# Patient Record
Sex: Male | Born: 1975 | Race: Black or African American | Hispanic: No
Health system: Southern US, Community
[De-identification: ages and names within clinical notes are randomized; demographics above are authoritative.]

---

## 2004-07-16 ENCOUNTER — Other Ambulatory Visit: Payer: Self-pay

## 2005-12-05 ENCOUNTER — Other Ambulatory Visit: Payer: Self-pay

## 2005-12-05 ENCOUNTER — Emergency Department: Payer: Self-pay | Admitting: Emergency Medicine

## 2005-12-30 ENCOUNTER — Emergency Department: Payer: Self-pay | Admitting: Emergency Medicine

## 2006-01-05 ENCOUNTER — Emergency Department: Payer: Self-pay | Admitting: Emergency Medicine

## 2006-11-29 ENCOUNTER — Inpatient Hospital Stay: Payer: Self-pay | Admitting: Internal Medicine

## 2007-04-26 ENCOUNTER — Emergency Department: Payer: Self-pay | Admitting: Emergency Medicine

## 2007-09-01 ENCOUNTER — Emergency Department: Payer: Self-pay | Admitting: Emergency Medicine

## 2007-09-24 ENCOUNTER — Ambulatory Visit: Payer: Self-pay

## 2007-10-26 ENCOUNTER — Emergency Department: Payer: Self-pay | Admitting: Emergency Medicine

## 2008-05-05 IMAGING — CR DG CHEST 1V PORT
1 series · 1 of 1 positions shown · non-contrast
Comparison: none

REASON FOR EXAM: Chest pain
COMMENTS:  LMP: (Male)

[view not recorded]
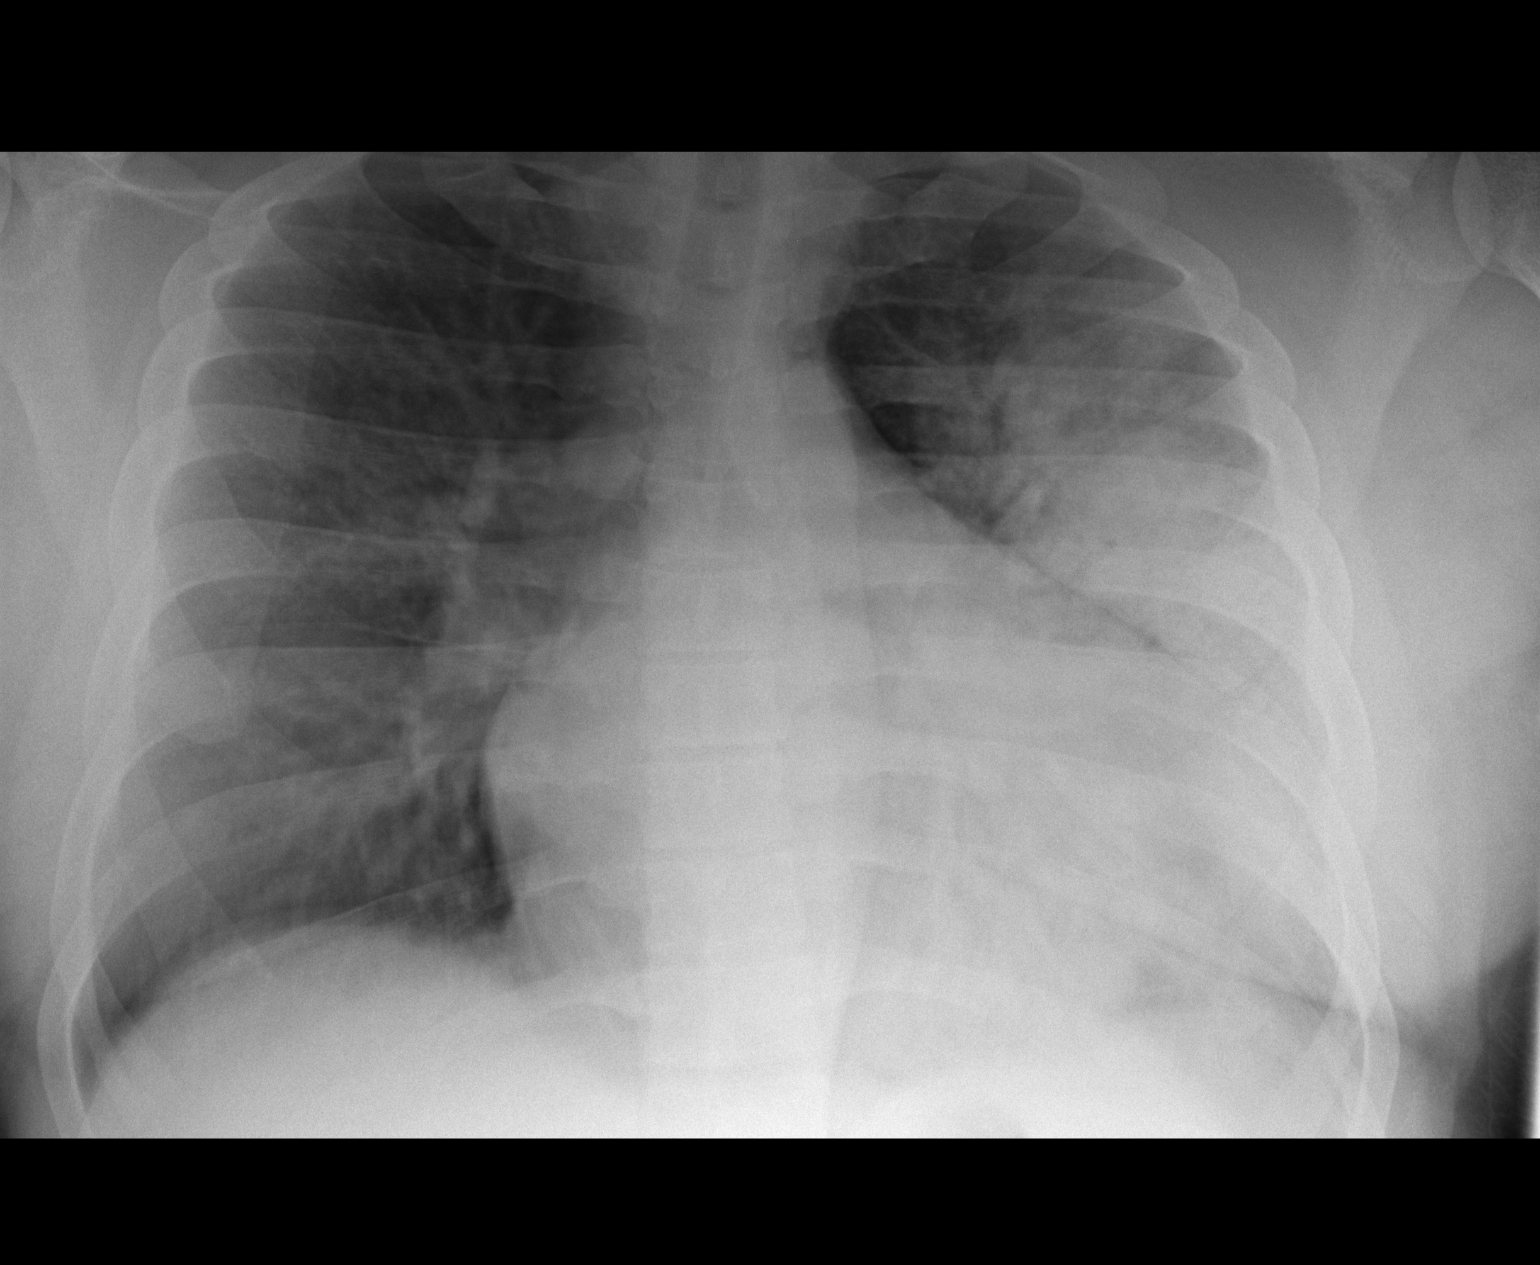

[1 of 1 positions shown; findings below may reference images not displayed]

PROCEDURE:     DXR - DXR PORTABLE CHEST SINGLE VIEW  - November 29, 2006  [DATE]

RESULT:     No recent chest films are available for comparison.

There is density present on the LEFT in the mid and upper hemithorax that is
new since November 2005. This is consistent with acute pneumonia. Soft
tissue density in the perihilar region on the RIGHT is present and stable.
The cardiac silhouette is enlarged but is also stable.
IMPRESSION: There is infiltrate in the LEFT lung consistent with
pneumonia. This appears new since the study of a year ago. A follow-up PA
and lateral chest x-ray would be of value following therapy to assure
clearing.

## 2011-07-03 ENCOUNTER — Encounter: Payer: Self-pay | Admitting: Family Medicine

## 2011-07-23 ENCOUNTER — Encounter: Payer: Self-pay | Admitting: Family Medicine

## 2011-08-23 ENCOUNTER — Encounter: Payer: Self-pay | Admitting: Family Medicine

## 2011-09-22 ENCOUNTER — Encounter: Payer: Self-pay | Admitting: Family Medicine

## 2011-12-05 ENCOUNTER — Inpatient Hospital Stay: Payer: Self-pay | Admitting: Internal Medicine

## 2015-04-15 NOTE — Discharge Summary (Signed)
PATIENT NAME:  Alexander Pratt, Alexander Pratt MR#:  [id number] DATE OF BIRTH:  December 10, 1976  DATE OF ADMISSION:  12/05/2011 DATE OF DISCHARGE:  12/07/2011  PRIMARY CARE PHYSICIAN: [facility]  REASON FOR ADMISSION: Seizure.    DISCHARGE DIAGNOSES:  1. Seizures, likely from medication noncompliance, running out of his antiepileptics at home.  2. Urinary tract infection.  3. Fever likely due to urinary tract infection.  4. Acute renal failure. 5. Hypokalemia.  6. History of hypertension. 7. 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  8. 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 9. 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  10. Leukocytosis.  11. History of chronic indwelling Foley catheter.  CONSULTATIONS: None.   LABORATORY, DIAGNOSTIC AND RADIOLOGICAL DATA:  Portable chest x-ray 12/04/2011: Very poor inspiratory effort. There is cardiomegaly. Very shallow inspiration with pulmonary vascular congestion, which could be artifactual given poor inspiratory effort. No significant effusion is seen. No mass or lobar pneumonia is evident.   Brain MRI without contrast 12/05/2011: Encephalomalacia from large old right middle RCA distribution infarct as well as old infarct in the left corona radiata. There is no acute infarct.   EEG from 12/06/2011: Abnormal EEG due to low amplitude generalized slowing of the background which is a nonspecific finding. Can be seen with multiple etiologies. There is no evidence of subclinical electrographic seizure activity from short recording.   BUN 20, creatinine 1.63 on admission. BUN 12, creatinine 1.06 on the day of discharge.   Serum Dilantin less than 0.4, serum valproic acid less than 3 on admission. Urine drug screen was negative on admission. CBC on admission WBC 11..4, hemoglobin 16.9, hematocrit 50.3, platelets 147.   CBC  normal at the time of discharge except for platelet count very slightly low at 148.   Blood cultures x2 from 12/04/2011: No growth to date.   Influenza antigen was negative.   Urinalysis on admission with cloudy urine, 3+ leukocyte esterase, positive nitrite, 3+ bacteria, 260 WBC. Urine culture from 12/04/2011 returned contaminated.   Serum potassium 3.2 on 12/06/2011 and 3.7 on the day of discharge.   Lipid panel: Total cholesterol 100, total cholesterol 94, triglycerides 100, HDL 28, LDL 46. Hemoglobin A1c 5.6.   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 Please see dictated admission history and physical for pertinent details surrounding the onset of this hospitalization. Please see below for further details. 1. Seizures-Likely secondary to medication noncompliance as patient had ran out of his antiepileptics at home. At home he was supposed to have been taking Keppra as well as Gabapentin. Patient is restarted on Keppra initially intravenously and thereafter he had no further seizures throughout the course of this hospitalization. MRI was obtained and revealed large old right MCA territory cerebrovascular accident with encephalomalacia and old corona radiata infarct but no acute intracranial abnormalities are noted. EEG showed generalized slowing, no epileptiform activity was noted. Since he has been restarted on his antiepileptics he has had no witnessed seizures thereafter in the hospital. Patient and patient's mother were explained the importance of taking antiepileptics as prescribed and to obtain refill for prescriptions prior to medications running out.   2. Urinary tract infection-Based off  abnormal urinalysis. Patient has a chronic indwelling Foley. Urine culture was obtained and initially this was contaminated with fecal flora and after cultures were obtained he was empirically started on IV antibiotics. He was noted to have fevers and this was felt to be from urinary tract infection. He was also noted to have leukocytosis which was felt to be from urinary tract infection versus stress induced. He is not septic and did not have systemic inflammatory response syndrome on admission and he was hemodynamically stable with normal blood pressure and heart rate and was only febrile with elevated WBC count. With antibiotic therapy patient has defervesced and his WBC count has normalized. He has completed five out of seven days of inpatient antibiotics and has two additional days of Levaquin therapy remaining at the time of discharge. Blood cultures did not reveal any growth to date.  3. Acute renal failure-Resolved. This appeared prerenal from volume depletion. His renal function has improved with hydration.  4. Hypokalemia-Potassium was replaced and hypokalemia resolved.  5. Hypertension-Blood pressure better controlled after restarting his Norvasc and his beta blocker was also restarted and he was maintained on nitrate therapy given history of congestive heart failure in addition to hydralazine. HCTZ was stopped as patient was volume depleted with prerenal acute renal failure on admission. He also received some p.r.n. IV hydralazine while hospitalized. Blood pressure is well controlled on the day of discharge.  6. 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 He was actually volume deplete with acute renal failure. He has been rehydrated with IV fluids. Would avoid ACE inhibitors for now given acute renal failure noted during this  hospitalization and will continue to hold his diuretic for now as he was volume deplete on admission. He will need to follow up with cardiology closely for history of congestive heart failure and viral cardiomyopathy. Patient did not experience any significant respiratory distress or chest pain during this hospitalization.  7. 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 LDL is at goal and lipids are diet controlled.   8. Leukocytosis-Likely stress-induced from seizure versus from urinary tract infection. Blood cultures did not reveal any growth to date. Urine culture returned contaminated. Patient was placed on antibiotics for his urinary tract infection and WBC count has normalized.  9. Disposition-Patient was seen by physical therapy prior to discharge and recommendation was made to discharge patient home with home health under the care of patient's mother and home health has been arranged for this patient.  10. On 12/07/2011 patient was hemodynamically stable and without any seizures and his WBC count normalized and he was afebrile and he was felt to be stable for discharge home with home health with close outpatient follow up to which the patient and his mother were agreeable.   DISCHARGE DISPOSITION: Home with home health under care of patient's mother.   DISCHARGE ACTIVITY: As tolerated.   DISCHARGE DIET: Low sodium, low fat, low cholesterol.   DISCHARGE MEDICATIONS:  1. Levaquin 500 mg daily x2 days. 2. Coreg 3.125 mg p.o. b.i.d.  3. Aspirin 81 mg daily. 4. Sistine Ophthalmic gel forming solution one drop to each eye b.i.d.  5. Keppra 1000 mg p.o. b.i.d.  6. Isosorbide dinitrate 20 mg p.o. t.i.d.  7. Hydralazine 25 mg p.o. t.i.d.  8. Lexapro 5 mg p.o. daily.  9. Baclofen 10 mg p.o. t.i.d.  10. Norvasc 5  mg p.o. daily.  11. Ranitidine 150 mg p.o. daily.  12. Neurontin 600 mg p.o. b.i.d.   DISCHARGE  INSTRUCTIONS: 1. Take medications as prescribed. 2. Return to Emergency Department for recurrence of symptoms.   FOLLOW UP INSTRUCTIONS: 1. [facility]. Patient needs repeat BMP within one week. 2. [facility] for congestive heart failure. 3. [facility] to 2 for neurogenic bladder and to change your suprapubic catheter.   TIME SPENT ON DISCHARGE: Greater than 30 minutes.   ____________________________ Elon AlasKamran N. Joan Avetisyan, MD knl:cms D: 12/11/2011 19:46:31 ET T: 12/12/2011 13:42:19 ET JOB#: [id number] cc: [facility] Scotty CourtKAMRAN N Raul Winterhalter MD ELECTRONICALLY SIGNED 12/25/2011 16:23
